# Patient Record
Sex: Female | Born: 1960 | Race: White | Hispanic: No | Marital: Single
Health system: Southern US, Community
[De-identification: ages and names within clinical notes are randomized; demographics above are authoritative.]

---

## 1997-12-17 ENCOUNTER — Ambulatory Visit (HOSPITAL_COMMUNITY): Admission: RE | Admit: 1997-12-17 | Discharge: 1997-12-17 | Payer: Self-pay | Admitting: Family Medicine

## 1998-07-21 ENCOUNTER — Ambulatory Visit: Admission: RE | Admit: 1998-07-21 | Discharge: 1998-07-21 | Payer: Self-pay | Admitting: Radiation Oncology

## 1998-12-14 ENCOUNTER — Encounter: Admission: RE | Admit: 1998-12-14 | Discharge: 1999-03-14 | Payer: Self-pay | Admitting: Radiation Oncology

## 2007-02-12 ENCOUNTER — Ambulatory Visit (HOSPITAL_COMMUNITY): Admission: RE | Admit: 2007-02-12 | Discharge: 2007-02-12 | Payer: Self-pay | Admitting: Family Medicine

## 2018-07-12 ENCOUNTER — Other Ambulatory Visit: Payer: Self-pay | Admitting: Plastic Surgery

## 2018-07-12 DIAGNOSIS — Z1231 Encounter for screening mammogram for malignant neoplasm of breast: Secondary | ICD-10-CM

## 2019-06-06 ENCOUNTER — Ambulatory Visit
Admission: RE | Admit: 2019-06-06 | Discharge: 2019-06-06 | Disposition: A | Discharge status: Home / Self Care | Payer: No Typology Code available for payment source | Source: Ambulatory Visit | Attending: Plastic Surgery | Admitting: Plastic Surgery

## 2019-06-06 ENCOUNTER — Other Ambulatory Visit: Payer: Self-pay

## 2019-06-06 DIAGNOSIS — Z1231 Encounter for screening mammogram for malignant neoplasm of breast: Secondary | ICD-10-CM

## 2019-06-10 ENCOUNTER — Other Ambulatory Visit: Payer: Self-pay | Admitting: Plastic Surgery

## 2019-06-10 DIAGNOSIS — R928 Other abnormal and inconclusive findings on diagnostic imaging of breast: Secondary | ICD-10-CM

## 2019-06-18 ENCOUNTER — Other Ambulatory Visit: Payer: No Typology Code available for payment source

## 2019-08-06 ENCOUNTER — Telehealth: Payer: Self-pay | Admitting: Orthopaedic Surgery

## 2019-08-06 NOTE — Telephone Encounter (Signed)
[employer] form received. Sent to [employer] 

## 2019-08-13 ENCOUNTER — Other Ambulatory Visit: Payer: Self-pay

## 2019-08-13 ENCOUNTER — Telehealth: Payer: Self-pay | Admitting: Orthopaedic Surgery

## 2019-08-13 ENCOUNTER — Ambulatory Visit (INDEPENDENT_AMBULATORY_CARE_PROVIDER_SITE_OTHER): Payer: Self-pay

## 2019-08-13 ENCOUNTER — Ambulatory Visit: Payer: No Typology Code available for payment source | Admitting: Family Medicine

## 2019-08-13 ENCOUNTER — Encounter: Payer: Self-pay | Admitting: Family Medicine

## 2019-08-13 DIAGNOSIS — Z8781 Personal history of (healed) traumatic fracture: Secondary | ICD-10-CM

## 2019-08-13 MED ORDER — HYDROCODONE-ACETAMINOPHEN 5-325 MG PO TABS: 1.0000 | ORAL_TABLET | Freq: Four times a day (QID) | ORAL | 0 refills | Status: DC | PRN
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
## 2019-08-13 NOTE — Telephone Encounter (Signed)
[employer] forms received. Sent to [employer]. 

## 2019-08-13 NOTE — Addendum Note (Signed)
Addended by: Lillia Carmel on: 08/13/2019 03:29 PM   Modules accepted: Orders

## 2019-08-14 ENCOUNTER — Telehealth: Payer: Self-pay | Admitting: Family Medicine

## 2019-08-14 NOTE — Telephone Encounter (Signed)
FYI Patient called stating [employer] is faxing a FMLA form to be completed and faxed back to them. Patient said she was told the form was faxed an hour ago. I explained the form will be forwarded to [facility] and she will be contacted by them.

## 2019-08-14 NOTE — Telephone Encounter (Signed)
Forms received and sent to [employer] 7/6

## 2019-08-19 ENCOUNTER — Telehealth: Payer: Self-pay | Admitting: Family Medicine

## 2019-08-19 MED ORDER — HYDROCODONE-ACETAMINOPHEN 5-325 MG PO TABS: 1.0000 | ORAL_TABLET | Freq: Four times a day (QID) | ORAL | 0 refills | Status: DC | PRN

## 2019-08-19 NOTE — Telephone Encounter (Signed)
Sent!

## 2019-08-19 NOTE — Telephone Encounter (Signed)
Patient called asking for refill of hydrocodone. Please send to pharmacy on file. Patient is requesting phone call when medication is sent in. Patient phone number is [phone].

## 2019-08-19 NOTE — Telephone Encounter (Signed)
Please advise 

## 2019-08-19 NOTE — Telephone Encounter (Signed)
I called and advised the patient.   The patient says her employer is wanting her to come back to work soon. She wants to know if it would be ok for her to work up to 4 hours daily, 2 hours in the morning and 2 in the afternoon, starting on 08/21/19. Please advise.

## 2019-08-20 NOTE — Telephone Encounter (Signed)
If she's talking about desk-type work, that should be ok.  But if it's her normal work, she should not do that until after her next visit in a couple weeks.

## 2019-08-20 NOTE — Telephone Encounter (Signed)
I called and advised the patient. 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 The patient will notify her employer. She will call us back if she needs a work note.

## 2019-08-22 ENCOUNTER — Telehealth: Payer: Self-pay | Admitting: Family Medicine

## 2019-08-22 NOTE — Telephone Encounter (Signed)
Patient called.   She was calling to check the status of paperwork that [employer] told her we had. She said the paperwork is for her disability claim and it just needs a signature from Dr. Prince Rome.   Call back: [phone]

## 2019-08-22 NOTE — Telephone Encounter (Signed)
I called the patient, leaving a voice mail: there are no forms awaiting signature in the office today. He did sign a form yesterday, but I did not see the name on the form. This was given back to medical records and should be in transit back to [employer]. I suggested she try calling [employer] again tomorrow to see if it has made it back to them.

## 2019-08-26 ENCOUNTER — Telehealth: Payer: Self-pay | Admitting: Family Medicine

## 2019-08-26 NOTE — Telephone Encounter (Signed)
I called: the patient c/o walking off-kilter, as if 1 leg is longer than the other. She is wondering if the rod that was put in for her femur fracture was too long. She has an appointment already scheduled for follow up on 7/27, but did not want to wait that long. Will see her tomorrow at 10:20 for a recheck (leaving the 7/27 appointment in place in case it will still be needed).

## 2019-08-26 NOTE — Telephone Encounter (Signed)
Patient called requesting a call back from Dr. Ramond Dial or his nurse. Patient did not specify what the call was about but asked for a return call at [phone].

## 2019-08-27 ENCOUNTER — Ambulatory Visit (INDEPENDENT_AMBULATORY_CARE_PROVIDER_SITE_OTHER): Payer: Self-pay | Admitting: Family Medicine

## 2019-08-27 ENCOUNTER — Other Ambulatory Visit: Payer: Self-pay

## 2019-08-27 ENCOUNTER — Ambulatory Visit (INDEPENDENT_AMBULATORY_CARE_PROVIDER_SITE_OTHER): Payer: Self-pay

## 2019-08-27 DIAGNOSIS — Z8781 Personal history of (healed) traumatic fracture: Secondary | ICD-10-CM

## 2019-08-27 NOTE — Progress Notes (Signed)
   Office Visit Note   Patient: Megan Garza           Date of Birth: 01-Sep-1960           MRN: [id number] Visit Date: 08/27/2019 Requested by: Karleen Hampshire, MD [address] [place],  [place] [address] PCP: Karleen Hampshire, MD  Subjective: Chief Complaint  Patient presents with  . Left Hip - Fracture, Pain, Follow-up    Back starts hurting after 30 minutes of walking - walks off balance. Uses cane. Still has to take pain med every 4 hours for pain in the hip.    HPI: He is here for follow-up status post left hip fracture with replacement.  She feels like her leg is too long.  It is very bothersome to her.               ROS:   All other systems were reviewed and are negative.  Objective: Vital Signs: There were no vitals taken for this visit.  Physical Exam:  General:  Alert and oriented, in no acute distress. Pulm:  Breathing unlabored. Psy:  Normal mood, congruent affect  Left hip: When she stands with her knees fully extended, her left leg definitely looks smaller than the right.  It appears to be roughly an inch longer.  Imaging: XR Pelvis 1-2 Views  Result Date: 08/27/2019 X-rays of the pelvis standing appear to show the right leg a little bit longer than the left, but upon questioning the patient, she was not standing with the left knee fully extended.   Assessment & Plan: 1.  Status post left hip replacement with apparent leg length discrepancy -1/2 inch heel lift placed in the right shoe.  She felt much better with this.  We will order a CT scan-o-gram per Dr. Magnus Ivan, and have her follow-up with him afterward to go over the results.     Procedures: No procedures performed  No notes on file     PMFS History: There are no problems to display for this patient.  No past medical history on file.  No family history on file.  Past Surgical History:  Procedure Laterality Date  . AUGMENTATION MAMMAPLASTY Bilateral 2000   Social History    Occupational History  . Not on file  Tobacco Use  . Smoking status: Not on file  Substance and Sexual Activity  . Alcohol use: Not on file  . Drug use: Not on file  . Sexual activity: Not on file

## 2019-08-28 ENCOUNTER — Other Ambulatory Visit (HOSPITAL_COMMUNITY): Payer: Self-pay | Admitting: Family Medicine

## 2019-08-28 ENCOUNTER — Other Ambulatory Visit: Payer: Self-pay | Admitting: Family Medicine

## 2019-08-28 DIAGNOSIS — M2170 Unequal limb length (acquired), unspecified site: Secondary | ICD-10-CM

## 2019-08-28 DIAGNOSIS — Z8781 Personal history of (healed) traumatic fracture: Secondary | ICD-10-CM

## 2019-08-29 ENCOUNTER — Telehealth: Payer: Self-pay | Admitting: Family Medicine

## 2019-08-29 MED ORDER — HYDROCODONE-ACETAMINOPHEN 5-325 MG PO TABS: 1.0000 | ORAL_TABLET | Freq: Every day | ORAL | 0 refills | Status: DC | PRN

## 2019-08-29 NOTE — Telephone Encounter (Signed)
Can you advise? Hilts out of office.

## 2019-08-29 NOTE — Telephone Encounter (Signed)
Patient called.   She is requesting a refill on her hydrocodone. She would also like a call back to discuss the future in her plan of care, says she is expecting a CT   Call back: [phone]

## 2019-08-29 NOTE — Addendum Note (Signed)
Addended by: Mayra Reel on: 08/29/2019 01:55 PM   Modules accepted: Orders

## 2019-08-30 ENCOUNTER — Telehealth: Payer: Self-pay | Admitting: Family Medicine

## 2019-08-30 NOTE — Telephone Encounter (Signed)
Ok to send this or does release have to be signed?

## 2019-08-30 NOTE — Telephone Encounter (Signed)
Pt called stating she needs proof of treatment sent to her so that she can get help paying bills.  [phone] Fax# [phone]

## 2019-09-02 ENCOUNTER — Telehealth: Payer: Self-pay | Admitting: Family Medicine

## 2019-09-02 MED ORDER — HYDROCODONE-ACETAMINOPHEN 5-325 MG PO TABS: 1.0000 | ORAL_TABLET | Freq: Every day | ORAL | 0 refills | Status: DC | PRN

## 2019-09-02 MED ORDER — MELOXICAM 15 MG PO TABS: 7.5000 mg | ORAL_TABLET | Freq: Every day | ORAL | 6 refills | Status: DC | PRN

## 2019-09-02 NOTE — Telephone Encounter (Signed)
Patient called requesting a refill of hydrocodone and some type of antibiotic due to inflammation. Please send to pharmacy on file. Patient phone number is [phone].

## 2019-09-02 NOTE — Telephone Encounter (Signed)
Please advise. She has a follow up appointment on 09/04/19.

## 2019-09-02 NOTE — Telephone Encounter (Signed)
Hydrocodone refilled.  Meloxicam sent as well (for inflammation).  If she's concerned about infection, needs to be seen again.

## 2019-09-02 NOTE — Telephone Encounter (Signed)
Verbal authorization accepted. This is for [employer]. I faxed [phone]

## 2019-09-02 NOTE — Telephone Encounter (Signed)
I called and advised her of the medications that were sent in to her pharmacy. She said the incision started looking a bit red, so she took some amoxicillin she had at home for 3 days, and it it looking better. The patient is scheduled for the CT tomorrow, with follow up with Dr. Prince Rome on 09/04/19 - will check the incision then. She said she can hold out until Wednesday's appointment.

## 2019-09-03 ENCOUNTER — Ambulatory Visit: Payer: Self-pay | Admitting: Family Medicine

## 2019-09-03 ENCOUNTER — Ambulatory Visit
Admission: RE | Admit: 2019-09-03 | Discharge: 2019-09-03 | Disposition: A | Discharge status: Home / Self Care | Payer: No Typology Code available for payment source | Source: Ambulatory Visit | Attending: Family Medicine | Admitting: Family Medicine

## 2019-09-03 DIAGNOSIS — Z8781 Personal history of (healed) traumatic fracture: Secondary | ICD-10-CM

## 2019-09-03 DIAGNOSIS — M2170 Unequal limb length (acquired), unspecified site: Secondary | ICD-10-CM

## 2019-09-04 ENCOUNTER — Ambulatory Visit: Payer: Self-pay | Admitting: Family Medicine

## 2019-09-04 ENCOUNTER — Telehealth: Payer: Self-pay | Admitting: Family Medicine

## 2019-09-04 NOTE — Telephone Encounter (Signed)
I called and advised the patient. 

## 2019-09-04 NOTE — Telephone Encounter (Signed)
CT for bone length showed a 1 cm difference between legs, left longer than right.

## 2019-09-05 ENCOUNTER — Encounter: Payer: Self-pay | Admitting: Orthopaedic Surgery

## 2019-09-05 ENCOUNTER — Ambulatory Visit (INDEPENDENT_AMBULATORY_CARE_PROVIDER_SITE_OTHER): Payer: Self-pay | Admitting: Orthopaedic Surgery

## 2019-09-05 DIAGNOSIS — M2170 Unequal limb length (acquired), unspecified site: Secondary | ICD-10-CM

## 2019-09-05 DIAGNOSIS — Z96642 Presence of left artificial hip joint: Secondary | ICD-10-CM

## 2019-09-05 DIAGNOSIS — Z8781 Personal history of (healed) traumatic fracture: Secondary | ICD-10-CM

## 2019-09-05 MED ORDER — HYDROCODONE-ACETAMINOPHEN 5-325 MG PO TABS: 1.0000 | ORAL_TABLET | Freq: Four times a day (QID) | ORAL | 0 refills | Status: DC | PRN
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
## 2019-09-16 ENCOUNTER — Ambulatory Visit: Payer: Self-pay | Admitting: Orthopaedic Surgery

## 2019-09-18 ENCOUNTER — Other Ambulatory Visit: Payer: Self-pay | Admitting: Family Medicine

## 2019-09-18 ENCOUNTER — Telehealth: Payer: Self-pay | Admitting: Family Medicine

## 2019-09-18 DIAGNOSIS — R600 Localized edema: Secondary | ICD-10-CM

## 2019-09-18 NOTE — Telephone Encounter (Signed)
I called the patient: she has been having swelling in both legs, left more than right, from the ankles to hips x 5 days. She took aspirin x 2 days and then because the swelling was worse yesterday, she took an eliquis. The swelling was not as bad today. I spoke with Dr. Prince Rome about this -- he recommended she go to the ED to be evaluated and have dopplers done today. The patient stated she is not at home right now and is going to wait until tomorrow to see how the swelling is doing --- She said she will go to the ED tomorrow if no better. I reiterated that it we are advising that she go today, but ultimately it is her choice.

## 2019-09-18 NOTE — Telephone Encounter (Signed)
Patient called advised she is experiencing a great deal if swelling in both lags. Patient is concerned that she may have a blood clot. The number to contact patient is [phone]

## 2019-09-20 ENCOUNTER — Ambulatory Visit (INDEPENDENT_AMBULATORY_CARE_PROVIDER_SITE_OTHER): Payer: Self-pay | Admitting: Family Medicine

## 2019-09-20 ENCOUNTER — Encounter: Payer: Self-pay | Admitting: Family Medicine

## 2019-09-20 ENCOUNTER — Other Ambulatory Visit: Payer: Self-pay

## 2019-09-20 DIAGNOSIS — Z8781 Personal history of (healed) traumatic fracture: Secondary | ICD-10-CM

## 2019-09-20 DIAGNOSIS — M25559 Pain in unspecified hip: Secondary | ICD-10-CM

## 2019-09-20 DIAGNOSIS — M25552 Pain in left hip: Secondary | ICD-10-CM

## 2019-09-20 MED ORDER — HYDROCODONE-ACETAMINOPHEN 5-325 MG PO TABS: 1.0000 | ORAL_TABLET | Freq: Four times a day (QID) | ORAL | 0 refills | Status: DC | PRN

## 2019-09-20 NOTE — Progress Notes (Signed)
   Office Visit Note   Patient: Megan Garza           Date of Birth: 02/13/1960           MRN: [id number] Visit Date: 09/20/2019 Requested by: Karleen Hampshire, MD [address] [place],  [place] [address] PCP: Karleen Hampshire, MD  Subjective: Chief Complaint  Patient presents with  . Left Hip - Pain    HPI: She is about 6 weeks status post fall resulting in left hip fracture which was treated with arthroplasty.  Pain is slowly improving.  She is struggling more with back pain than hip pain due to the leg length discrepancy.  2 days ago she called with swelling in both legs, she was out of town at the time.  We recommended going to the ER for evaluation but she instead started taking Eliquis and the swelling has gone down again.  She would like to go back to work half days starting next week if possible.              ROS:   All other systems were reviewed and are negative.  Objective: Vital Signs: There were no vitals taken for this visit.  Physical Exam:  General:  Alert and oriented, in no acute distress. Pulm:  Breathing unlabored. Psy:  Normal mood, congruent affect. Skin: Her surgical wound looks good, some scabs still present but no drainage, erythema. Legs: No significant peripheral edema today.  Imaging: No results found.  Assessment & Plan: 1.  Doing well 6 weeks status post left total hip arthroplasty -Okay to resume half days work starting next week.  After 2 weeks of that, she will increase to full days if tolerated. -Refilled hydrocodone to use sparingly for pain.     Procedures: No procedures performed  No notes on file     PMFS History: There are no problems to display for this patient.  History reviewed. No pertinent past medical history.  History reviewed. No pertinent family history.  Past Surgical History:  Procedure Laterality Date  . AUGMENTATION MAMMAPLASTY Bilateral 2000   Social History   Occupational History  . Not on  file  Tobacco Use  . Smoking status: Not on file  Substance and Sexual Activity  . Alcohol use: Not on file  . Drug use: Not on file  . Sexual activity: Not on file

## 2019-09-23 ENCOUNTER — Ambulatory Visit: Payer: Self-pay | Admitting: Family Medicine

## 2019-09-24 ENCOUNTER — Telehealth: Payer: Self-pay

## 2019-09-24 NOTE — Telephone Encounter (Signed)
Do you have any suggestions or restrictions for her?

## 2019-09-24 NOTE — Telephone Encounter (Signed)
Patient called in wanting to speak about getting her a letter for work with limitations on how much she can lift . Says she returns back to work tomorrow

## 2019-09-24 NOTE — Telephone Encounter (Signed)
Whatever Magnus Ivan would tell his patients after hip replacement would be fine with me.  I was under the impression that she didn't do any lifting at work.

## 2019-09-25 ENCOUNTER — Telehealth: Payer: Self-pay | Admitting: Family Medicine

## 2019-09-25 NOTE — Telephone Encounter (Signed)
LMOM for patient that usually when she is released back to work it is without restrictions If she needs any restrictions we will definitely get those for her  I told her to call back if she had any questions

## 2019-09-25 NOTE — Telephone Encounter (Signed)
Patient called asking if return to work with no restrictions  paperwork has been sent to [employer] for clearance. Patient states she is unable to return to work without [employer] return to work documents being submitted Please call and patient also asking for copy of [employer] clearance. Please call patient at [phone].

## 2019-09-26 ENCOUNTER — Telehealth: Payer: Self-pay

## 2019-09-26 NOTE — Telephone Encounter (Signed)
Nothing since July has been received.

## 2019-09-26 NOTE — Telephone Encounter (Signed)
Have you seen something recently, to be signed by either Dr. Prince Rome or Dr. Magnus Ivan on this patient?

## 2019-09-26 NOTE — Telephone Encounter (Signed)
I spoke with the patient today. Please see other message on this.

## 2019-09-26 NOTE — Telephone Encounter (Signed)
The patient is requesting a new work note be sent to her employer and [employer] today, with updated restrictions. Requesting the following (if ok with Dr. Prince Rome): work 4 hour days from 09/27/19 - 10/07/19. No bending, stooping nor lifting >20 lbs from 09/27/19 - 10/17/19. No restrictions after that. Please advise if this is ok.   Email to [email or web address] & fax to [employer] #[phone], leave [phone].

## 2019-09-26 NOTE — Telephone Encounter (Signed)
That should be ok 

## 2019-09-26 NOTE — Telephone Encounter (Signed)
Note has been written, emailed and faxed - patient advised.

## 2019-09-27 ENCOUNTER — Telehealth: Payer: Self-pay

## 2019-09-27 ENCOUNTER — Telehealth: Payer: Self-pay | Admitting: Family Medicine

## 2019-09-27 NOTE — Telephone Encounter (Signed)
Pt would also like a copy left at the front desk so she can come by @ 8:30 and pick it up

## 2019-09-27 NOTE — Telephone Encounter (Signed)
Pt called stating [employer] never received the fax from yesterday and would like for [place] to try again. Pt verified the fax number is   [phone]   ; pt would like a CB when its been faxed.  [phone]

## 2019-09-27 NOTE — Telephone Encounter (Signed)
See other note this has been addressed.  

## 2019-09-27 NOTE — Telephone Encounter (Signed)
IC s/w patient and she said this was addressed.

## 2019-09-27 NOTE — Telephone Encounter (Signed)
I checked previous note while on the phone with patient she advised that they told her they did not recieve a fax from [place].

## 2019-09-27 NOTE — Telephone Encounter (Signed)
IC s/w patient and resubmitted to Loni Dolly [phone] and also faxed to [employer] [phone]

## 2019-09-27 NOTE — Telephone Encounter (Signed)
Patient wanted to speak about going to work Monday , says [employer] in [place] . Wanted letter to be changed she will get an occurrence.  Says she wants to speak with terri or [employer]

## 2019-10-08 ENCOUNTER — Telehealth: Payer: Self-pay | Admitting: Family Medicine

## 2019-10-08 NOTE — Telephone Encounter (Signed)
Patient requesting a call back from Terri. Patient did not disclose the nature of her call. Please call patient at [phone].

## 2019-10-09 NOTE — Telephone Encounter (Signed)
Left voice mail to call back 

## 2019-10-10 NOTE — Telephone Encounter (Signed)
Left another voice mail to call back (call goes straight to voice mail).

## 2019-10-11 ENCOUNTER — Telehealth: Payer: Self-pay | Admitting: Family Medicine

## 2019-10-11 MED ORDER — HYDROCODONE-ACETAMINOPHEN 5-325 MG PO TABS: 1.0000 | ORAL_TABLET | Freq: Four times a day (QID) | ORAL | 0 refills | Status: DC | PRN

## 2019-10-11 NOTE — Telephone Encounter (Signed)
Pt would like a refill of her hydrocodone please

## 2019-10-11 NOTE — Telephone Encounter (Signed)
Sent!

## 2019-10-11 NOTE — Telephone Encounter (Signed)
I called and advised the patient. 

## 2019-10-11 NOTE — Telephone Encounter (Signed)
Please advise 

## 2019-10-29 ENCOUNTER — Ambulatory Visit (HOSPITAL_COMMUNITY): Payer: Self-pay

## 2020-03-24 ENCOUNTER — Other Ambulatory Visit: Payer: Self-pay | Admitting: Plastic Surgery

## 2020-03-24 DIAGNOSIS — R928 Other abnormal and inconclusive findings on diagnostic imaging of breast: Secondary | ICD-10-CM

## 2020-04-07 ENCOUNTER — Other Ambulatory Visit: Payer: Self-pay

## 2020-04-10 ENCOUNTER — Telehealth: Payer: Self-pay

## 2020-04-10 NOTE — Telephone Encounter (Signed)
I did call the patient back around 11:20 am: she repeated what she said about the "blood clots" in her mouth and asked if blood clots should be an issue for her this far out from hip surgery (June 2021). She is not on any blood thinners. Denies any stomach/esophageal issues, sinus issues, cough nor chest congestion. No other issues to explain the blood. I consulted with Dr. Prince Rome who confirmed blood clots are not usually an issue this far out. She did say her BP was elevated at her physical 2 weeks ago. No blood work was done. I advised her to contact that doctor for guidance on this.

## 2020-04-10 NOTE — Telephone Encounter (Signed)
Patient called she stated when she woke up this morning she had blood clots in her mouth she stated it was about 3 or 4 clots came up after she spit out the ones that were in her mouth she would like a call back soon [phone]

## 2020-06-19 ENCOUNTER — Other Ambulatory Visit: Payer: Self-pay

## 2020-06-19 ENCOUNTER — Ambulatory Visit (INDEPENDENT_AMBULATORY_CARE_PROVIDER_SITE_OTHER): Payer: Self-pay

## 2020-06-19 ENCOUNTER — Ambulatory Visit (INDEPENDENT_AMBULATORY_CARE_PROVIDER_SITE_OTHER): Payer: Self-pay | Admitting: Family Medicine

## 2020-06-19 DIAGNOSIS — M79641 Pain in right hand: Secondary | ICD-10-CM
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
## 2021-08-19 IMAGING — MG DIGITAL SCREENING IMPLANTS W/ CAD
8 series · 8 of 8 positions shown · non-contrast
Comparison: None.

CLINICAL DATA: Screening.

EXAM:
DIGITAL SCREENING BILATERAL MAMMOGRAM WITH IMPLANTS AND CAD
The patient has retropectoral implants. Standard and implant
displaced views were performed.

[L MLO (1 of 2)]
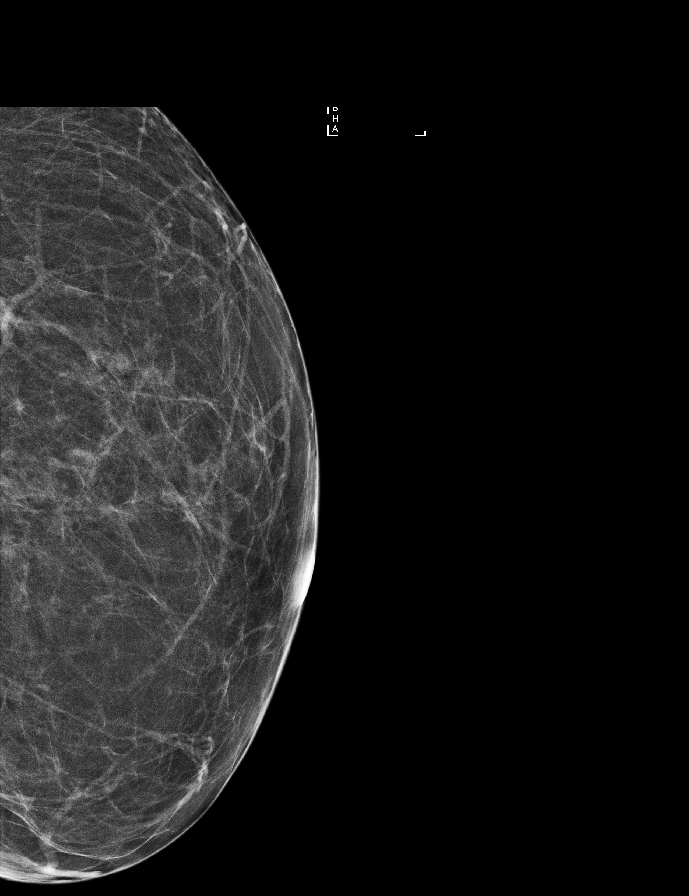

[L CC (1 of 2)]
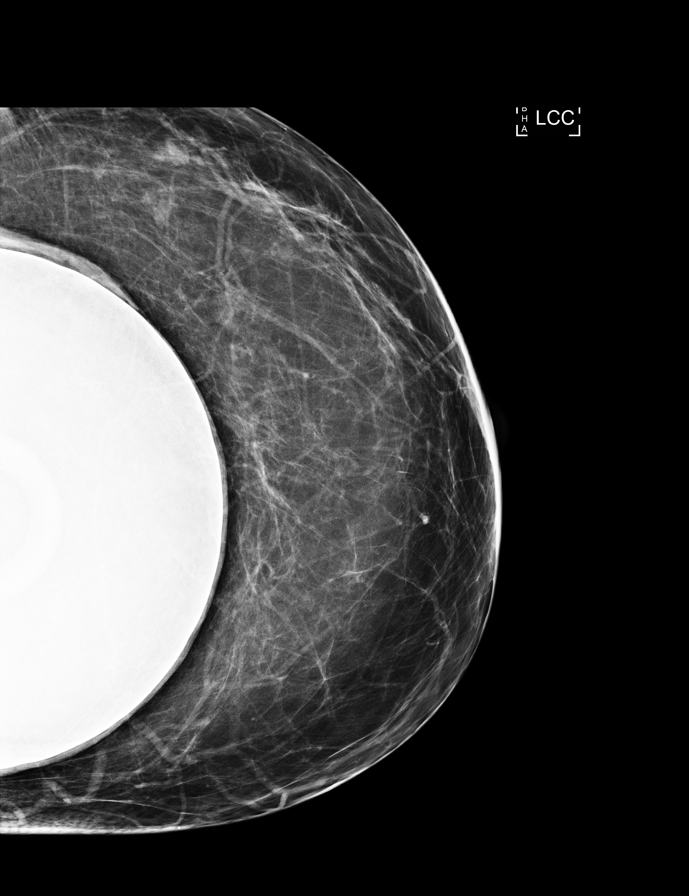

[R MLO (1 of 2)]
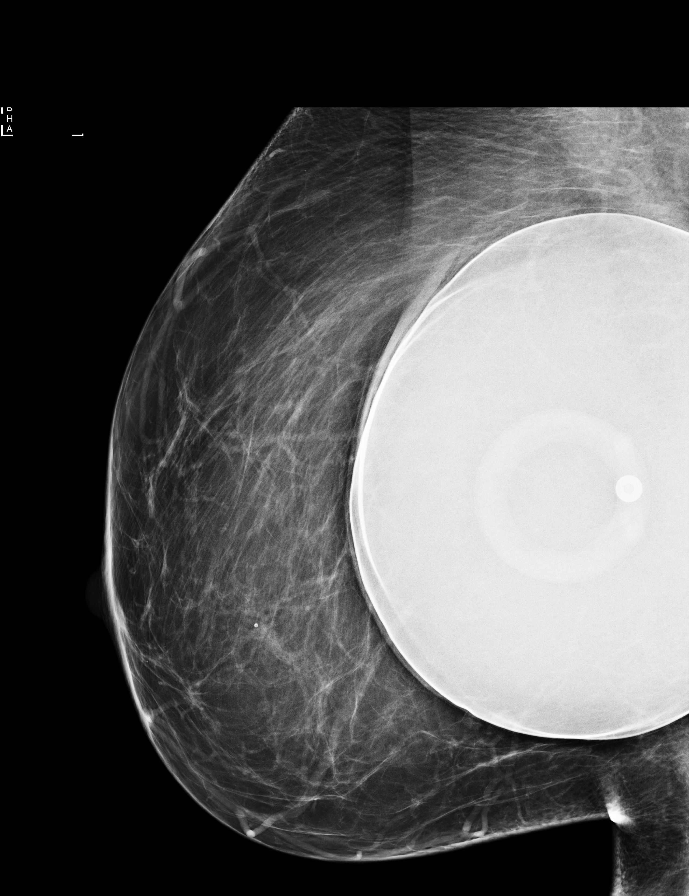

[L MLO (2 of 2)]
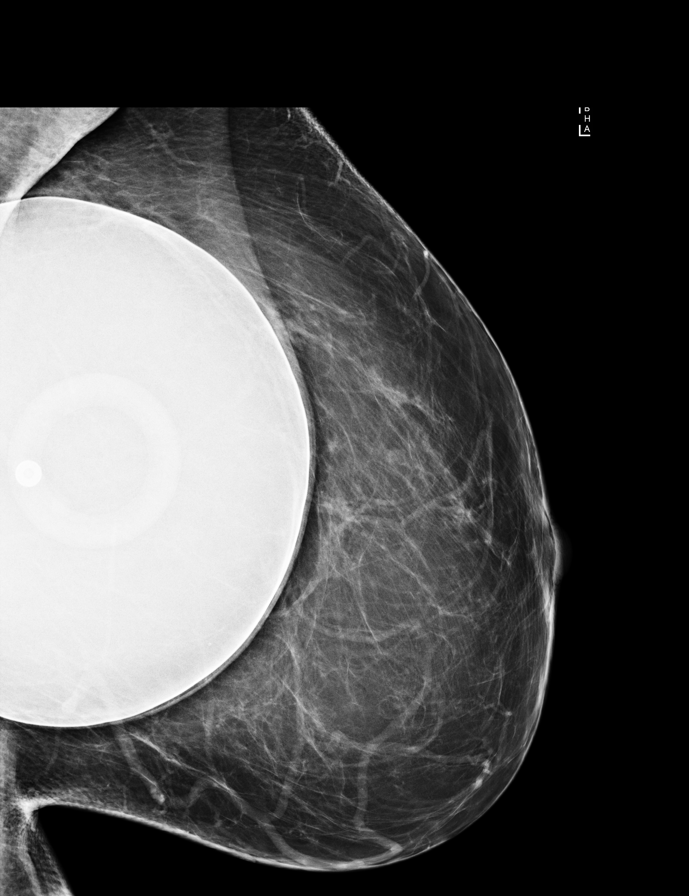

[R CC (1 of 2)]
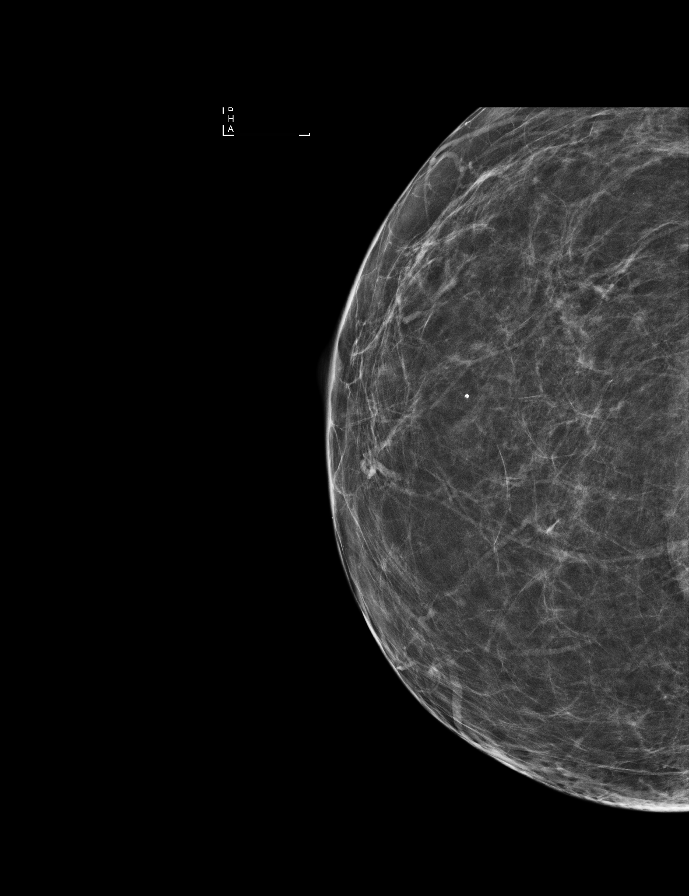

[L CC (2 of 2)]
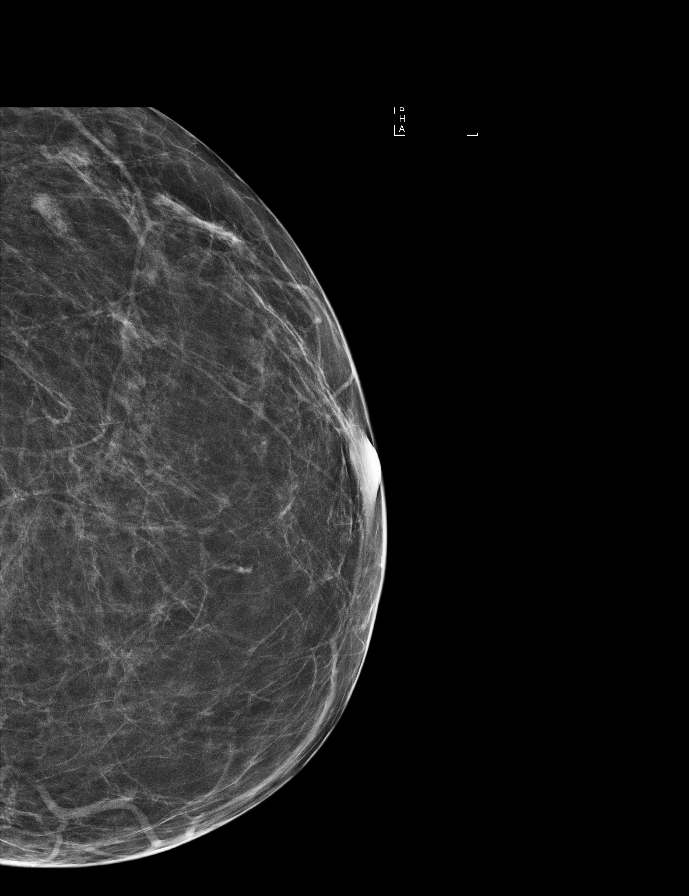

[R MLO (2 of 2)]
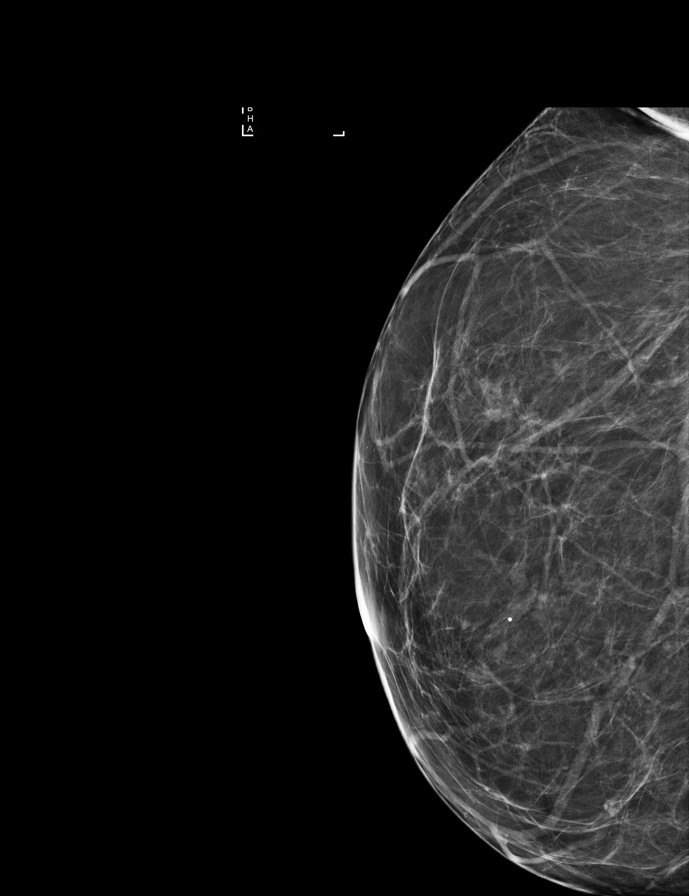

[R CC (2 of 2)]
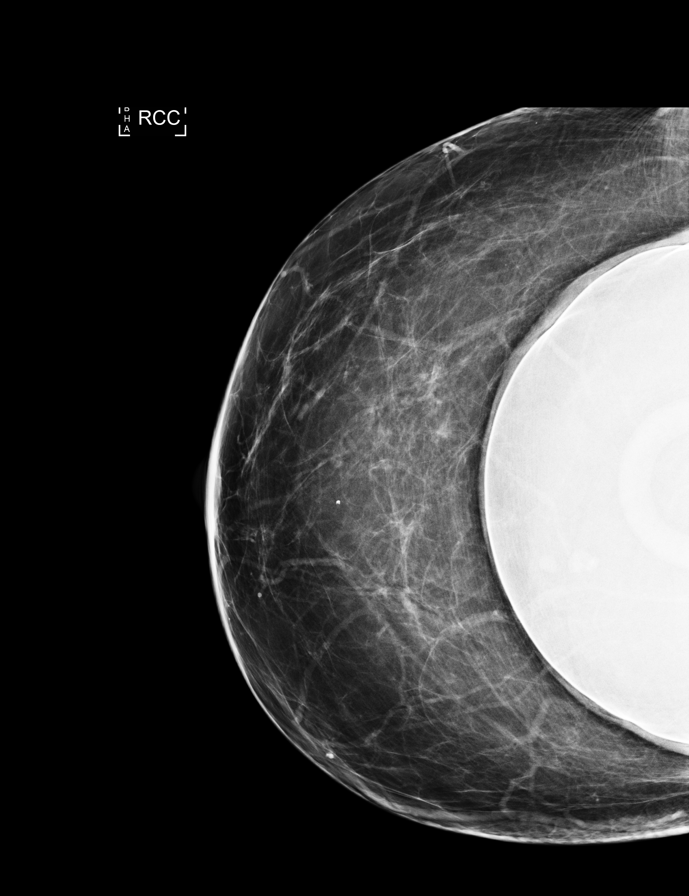

[8 of 8 positions shown; findings below may reference images not displayed]

ACR Breast Density Category b: There are scattered areas of
fibroglandular density.
FINDINGS: In the left breast, a possible mass warrants further evaluation. In
the right breast, no findings suspicious for malignancy. Images were
processed with CAD.
IMPRESSION: Further evaluation is suggested for possible mass in the left
breast.

RECOMMENDATION:
Diagnostic mammogram and possibly ultrasound of the left breast.
(Code:7K-T-KKA)

BI-RADS CATEGORY  0: Incomplete. Need additional imaging evaluation
and/or prior mammograms for comparison.

## 2021-11-16 IMAGING — CT CT BONE LENGTH
1 series · 1 of 1 positions shown · non-contrast
Comparison: None.

CLINICAL DATA: Leg length discrepancy. History of total left hip
arthroplasty.

EXAM:
CT OF THE LOWER BILATERAL EXTREMITY WITHOUT CONTRAST
TECHNIQUE: Multidetector CT imaging of the lower bilateral extremity was
performed according to the standard protocol.

[Series 2: topogram 0.70 tr20 · coronal · 3.00mm/px · 1 of 1 slices shown]
[im 1/1]
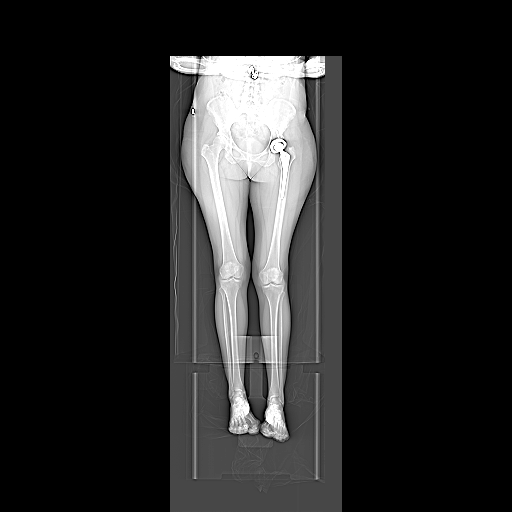

[1 of 1 positions shown; findings below may reference images not displayed]

FINDINGS: Measuring from the acetabular roof to the tibial plafond, the right
lower extremity measures 76.5 cm. The left lower extremity measures
77.5 cm.

Total left hip arthroplasty noted. No complicating features. The
femurs and tibias are intact.
IMPRESSION: 1 cm leg length discrepancy as detailed above.

## 2023-03-23 ENCOUNTER — Telehealth: Payer: Self-pay

## 2023-03-23 NOTE — Telephone Encounter (Signed)
Pt contacted [facility] to request enrollment into the [facility] uninsured program and to be screened for eligibility and phone pre-health assessment interview was completed   Pt states she would like to obtain access to a PCP to be able to get a health mammogram due to a family hx of breast cancer.  Denies any current medical issues or having a medical history of any chronic illness.  States she do smoke and occassionally drink alcohol (beer or wine)  She states she has Last seen a PCP over 5 years or more, but has not received a routine annual physical in over a year by a PCP   PLAN  -Pt Scheduled a placed  appointment with the [facility] enrollment team for Wed 2.19.25 at 10:30am) on their calendar  -Pt states she has no preference with PCPs; therefore I will plan to connect patient with the [facility].   Pt stated she understood all information discussed and call then ended
